# Patient Record
Sex: Male | Born: 1985 | Race: White | Hispanic: No | Marital: Single | Smoking: Current every day smoker
Health system: Southern US, Community
[De-identification: ages and names within clinical notes are randomized; demographics above are authoritative.]

## PROBLEM LIST (undated history)

## (undated) DIAGNOSIS — S83209A Unspecified tear of unspecified meniscus, current injury, unspecified knee, initial encounter: Secondary | ICD-10-CM

---

## 2020-11-21 ENCOUNTER — Emergency Department (HOSPITAL_COMMUNITY): Payer: 59

## 2020-11-21 ENCOUNTER — Emergency Department (HOSPITAL_COMMUNITY)
Admission: EM | Admit: 2020-11-21 | Discharge: 2020-11-21 | Disposition: A | Discharge status: Home / Self Care | Payer: 59 | Attending: Emergency Medicine | Admitting: Emergency Medicine

## 2020-11-21 ENCOUNTER — Encounter (HOSPITAL_COMMUNITY): Payer: Self-pay | Admitting: Emergency Medicine

## 2020-11-21 DIAGNOSIS — W16822A Jumping or diving into other water striking bottom causing other injury, initial encounter: Secondary | ICD-10-CM | POA: Diagnosis not present

## 2020-11-21 DIAGNOSIS — M25461 Effusion, right knee: Secondary | ICD-10-CM | POA: Insufficient documentation

## 2020-11-21 DIAGNOSIS — M25561 Pain in right knee: Secondary | ICD-10-CM

## 2020-11-21 NOTE — Discharge Instructions (Addendum)
At this time there does not appear to be the presence of an emergent medical condition, however there is always the potential for conditions to change. Please read and follow the below instructions.  Please return to the Emergency Department immediately for any new or worsening symptoms. Please be sure to follow up with your Primary Care Provider within one week regarding your visit today; please call their office to schedule an appointment even if you are feeling better for a follow-up visit. Please use the knee immobilizer and crutches given to you today.  Please keep weight off of your right leg until you are seen and evaluated by the orthopedic specialist.  Please call the orthopedic specialist Dr. Carola Frost on your discharge paperwork for a follow-up appointment and further treatment of your right knee injury. Please take Ibuprofen (Advil, motrin) and Tylenol (acetaminophen) to relieve your pain.  You may take up to 400 MG (2 pills) of normal strength ibuprofen every 8 hours as needed.  You make take tylenol, up to 500 mg (one extra strength pill) every 8 hours as needed. It is safe to take ibuprofen and tylenol at the same time as they work differently.  Do not take more than 3,000 mg tylenol in a 24 hour period (not more than one dose every 8 hours.  Please check all medication labels as many medications such as pain and cold medications may contain tylenol.  Do not drink alcohol while taking these medications.  Do not take other NSAID'S while taking ibuprofen (such as aleve or naproxen).  Please take ibuprofen with food to decrease stomach upset.  Go to the nearest Emergency Department immediately if: You have fever or chills Your knee swells, and the swelling gets worse. You cannot move your knee. You have very bad knee pain that does not get better with pain medicine. You have redness around your knee You have any new/concerning or worsening of symptoms    Please read the additional  information packets attached to your discharge summary.  Do not take your medicine if  develop an itchy rash, swelling in your mouth or lips, or difficulty breathing; call 911 and seek immediate emergency medical attention if this occurs.  You may review your lab tests and imaging results in their entirety on your MyChart account.  Please discuss all results of fully with your primary care provider and other specialist at your follow-up visit.  Note: Portions of this text may have been transcribed using voice recognition software. Every effort was made to ensure accuracy; however, inadvertent computerized transcription errors may still be present.

## 2020-11-21 NOTE — ED Notes (Signed)
Patient transported to x-ray. ?

## 2020-11-21 NOTE — ED Provider Notes (Signed)
[facility] EMERGENCY DEPARTMENT Provider Note   CSN: [id number] Arrival date & time: 11/21/20  0840     History Chief Complaint  Patient presents with   Knee Pain    Harold Dudley is a 35 y.o. male otherwise healthy no daily medication use.  Patient reports that he initially injured his right knee in December 2021, he had a family member on his back and attempted to lift them up to "pop his cousins back" when he felt a pop in his right knee.  He reports he had some pain initially that slowly resolved, his knee had been feeling better over the past few months.  On Thursday, 4 days ago, patient was attempting to jump over a small ditch near his apartment complex, when he landed he felt his right knee pop again.  He reports immediate onset medial right knee pain constant nonradiating worse with ambulation improves with rest and elevation.  Patient noticed some swelling along the right knee the next day.  Denies head injury, loss conscious, blood thinner use, neck pain, chest pain, back pain, abdominal pain, numbness/tingling, weakness, pain of the other extremities or any additional concerns.  HPI     History reviewed. No pertinent past medical history.  There are no problems to display for this patient.        No family history on file.     Home Medications Prior to Admission medications   Not on File    Allergies    Patient has no known allergies.  Review of Systems   Review of Systems  Constitutional: Negative.  Negative for chills and fever.  Cardiovascular: Negative.  Negative for chest pain.  Gastrointestinal: Negative.  Negative for abdominal pain.  Musculoskeletal: Positive for arthralgias. Negative for back pain and neck pain.  Skin: Negative for color change.  Neurological: Negative.  Negative for syncope, weakness and headaches.    Physical Exam Updated Vital Signs BP (!) 132/95 (BP Location: Right Arm)    Pulse 95    Temp 97.8 F  (36.6 C) (Oral)    Resp 14    SpO2 100%   Physical Exam Constitutional:      General: He is not in acute distress.    Appearance: Normal appearance. He is well-developed. He is not ill-appearing or diaphoretic.  HENT:     Head: Normocephalic and atraumatic.  Eyes:     General: Vision grossly intact. Gaze aligned appropriately.     Pupils: Pupils are equal, round, and reactive to light.  Neck:     Trachea: Trachea and phonation normal.  Cardiovascular:     Pulses:          Dorsalis pedis pulses are 2+ on the right side and 2+ on the left side.  Pulmonary:     Effort: Pulmonary effort is normal. No respiratory distress.  Abdominal:     General: There is no distension.     Palpations: Abdomen is soft.     Tenderness: There is no abdominal tenderness. There is no guarding or rebound.  Musculoskeletal:        General: Normal range of motion.     Cervical back: Normal range of motion.     Comments: Right Knee: Moderate suprapatellar effusion present otherwise appearance is normal.  No obvious deformity.  No erythema, increased heat or break of the skin.  Tenderness over medial joint line.  Flexion and extension reduced secondary to pain.  Resisted flexion and extension intact.  Patient is  able to straight leg raise, no evidence for patella tendon rupture negative anterior/posterior drawers.  No pain with varus stress test.  Positive pain with valgus stress test. No tenderness to palpation of hips or ankles. No tenderness along the tibial plateau.  Compartments soft. Neurovascularly intact distally to site of injury.  Good capillary refill and sensation intact to all toes.  Strong equal pedal pulses.   Feet:     Right foot:     Protective Sensation: 5 sites tested. 5 sites sensed.     Left foot:     Protective Sensation: 5 sites tested. 5 sites sensed.  Skin:    General: Skin is warm and dry.  Neurological:     Mental Status: He is alert.     GCS: GCS eye subscore is 4. GCS verbal  subscore is 5. GCS motor subscore is 6.     Comments: Speech is clear and goal oriented, follows commands Major Cranial nerves without deficit, no facial droop Moves extremities without ataxia, coordination intact  Psychiatric:        Behavior: Behavior normal.     ED Results / Procedures / Treatments   Labs (all labs ordered are listed, but only abnormal results are displayed) Labs Reviewed - No data to display  EKG None  Radiology DG Knee Complete 4 Views Right  Result Date: 11/21/2020 CLINICAL DATA:  Anterolateral right knee pain and swelling. Injury in December 2021. EXAM: RIGHT KNEE - COMPLETE 4+ VIEW COMPARISON:  None. FINDINGS: Moderate suprapatellar right knee joint effusion. No fracture or dislocation. No suspicious focal osseous lesions. No significant degenerative changes. No radiopaque foreign bodies. IMPRESSION: Moderate suprapatellar right knee joint effusion. No fracture or malalignment. Electronically Signed   By: Delbert Phenix M.D.   On: 11/21/2020 09:10    Procedures Procedures   Medications Ordered in ED Medications - No data to display  ED Course  I have reviewed the triage vital signs and the nursing notes.  Pertinent labs & imaging results that were available during my care of the patient were reviewed by me and considered in my medical decision making (see chart for details).    MDM Rules/Calculators/A&P                         Additional history obtained from: 1. Nursing notes from this visit. ------------------ 35 year old male presents for injury of the right knee initially he injured it lifting his cousin in December.  He reinjured it 4 days ago jumping over a small ditch.  He has been unable to ambulate on the knee secondary to pain since that time.  He has suprapatellar knee effusion on exam and some medial joint line pain and pain with valgus stress test.  Patella tendon intact.  Resisted flexion extension is intact.  He has no tenderness along the  tibial plateau.  Exam is suspicious for soft tissue injury, ligamentous possibly meniscus.  I have personally reviewed patient's x-ray and agree with radiologist interpretation, he has an effusion but no evidence for fracture or dislocation.  Plan of care will be knee immobilizer, crutches and nonweightbearing until evaluated by orthopedist.  Patient given referral to on-call orthopedist Dr. Carola Frost and encouraged to call office today to schedule a follow-up appointment.  Work note given.  RICE therapy discussed along with OTC anti-inflammatories.  No evidence for septic arthritis, DVT, compartment syndrome, cellulitis, fracture/dislocation, neurovascular compromise/arterial injury or other emergent pathologies at this time.  At this time  there does not appear to be any evidence of an acute emergency medical condition and the patient appears stable for discharge with appropriate outpatient follow up. Diagnosis was discussed with patient who verbalizes understanding of care plan and is agreeable to discharge. I have discussed return precautions with patient who verbalizes understanding. Patient encouraged to follow-up with their PCP and Dr. Carola Frost. All questions answered.   Note: Portions of this report may have been transcribed using voice recognition software. Every effort was made to ensure accuracy; however, inadvertent computerized transcription errors may still be present. Final Clinical Impression(s) / ED Diagnoses Final diagnoses:  Acute pain of right knee  Effusion of right knee    Rx / DC Orders ED Discharge Orders    None       Elizabeth Palau 11/21/20 4098    Margarita Grizzle, MD 11/22/20 1357

## 2020-11-21 NOTE — ED Triage Notes (Signed)
Pt arrives with right knee pain, he reports an initial injury in dec when trying to pop his family members back he heard his knee pop 3 times. This injury slowly got better and then on Thursday 3/17 he re injured his knee and felt like he bent to the side has had increase in swelling and pain. He is wearing an ace bandage and using crutches.

## 2020-11-21 NOTE — Progress Notes (Signed)
Orthopedic Tech Progress Note Patient Details:  Harold Dudley 31-Aug-1986 [id number]  Ortho Devices Type of Ortho Device: Knee Immobilizer,Crutches Ortho Device/Splint Location: RLE Ortho Device/Splint Interventions: Ordered,Application,Adjustment   Post Interventions Patient Tolerated: Ambulated well,Well Instructions Provided: Poper ambulation with device,Care of device   Donald Pore 11/21/2020, 10:02 AM

## 2020-11-22 ENCOUNTER — Encounter: Payer: Self-pay | Admitting: Orthopedic Surgery

## 2020-11-22 ENCOUNTER — Ambulatory Visit (INDEPENDENT_AMBULATORY_CARE_PROVIDER_SITE_OTHER): Payer: 59 | Admitting: Physician Assistant

## 2020-11-22 DIAGNOSIS — S83242D Other tear of medial meniscus, current injury, left knee, subsequent encounter: Secondary | ICD-10-CM | POA: Diagnosis not present

## 2020-11-22 NOTE — Progress Notes (Signed)
Office Visit Note   Patient: Harold Dudley           Date of Birth: 08-05-1986           MRN: [id number] Visit Date: 11/22/2020              Requested by: No referring provider defined for this encounter. PCP: Pcp, No  Chief Complaint  Patient presents with  . Right Knee - Injury      HPI: This is a pleasant 35 year old gentleman who works maintenance person for apartment complexes.  He states in December he was lifting a family member on his back and felt a significant knee pain.  He also had some associated mild swelling.  He did have some mild symptoms of instability at that time.  He did self treat this but it never did really recover.  He has used anti-inflammatories.  A few days ago he was jumping over a small ditch with his dog.  He landed on his "good leg "and then slipped in the mud and had a twisting injury to his right knee.  He had significant pain and since has had feelings of instability.  He does focus most of the pain around the medial patellar border and medial joint line.  He also finds flexion of his knee quite painful.  He is wearing a knee immobilizer today and using crutches  Assessment & Plan: Visit Diagnoses:  1. Other tear of medial meniscus, current injury, left knee, subsequent encounter     Plan: Findings concerning for acute meniscus injury and possible ligamentous injury.  Recommend MRI.  This is now but injury that occurred over 3 months ago and had exacerbation causing instability.  Follow-Up Instructions: No follow-ups on file.   Ortho Exam  Patient is alert, oriented, no adenopathy, well-dressed, normal affect, normal respiratory effort. Right knee he has a mild suprapatellar effusion.  No erythema.  No tenderness over the lateral joint line.  He does have some tenderness over the medial joint line and medial patellar border.  He is able to sustain a straight leg raise.  He is able to extend against resistance.  He is very hesitant to flex his knee  past 50 degrees.  Terminal extension is not very painful.  Has some increased translation when compared to the unaffected side with anterior draw  Imaging: No results found. No images are attached to the encounter.  Labs: No results found for: HGBA1C, ESRSEDRATE, CRP, LABURIC, REPTSTATUS, GRAMSTAIN, CULT, LABORGA   No results found for: ALBUMIN, PREALBUMIN, CBC  No results found for: MG No results found for: VD25OH  No results found for: PREALBUMIN No flowsheet data found.   There is no height or weight on file to calculate BMI.  Orders:  Orders Placed This Encounter  Procedures  . MR Knee Right w/o contrast   No orders of the defined types were placed in this encounter.    Procedures: No procedures performed  Clinical Data: No additional findings.  ROS:  All other systems negative, except as noted in the HPI. Review of Systems  Objective: Vital Signs: There were no vitals taken for this visit.  Specialty Comments:  No specialty comments available.  PMFS History: There are no problems to display for this patient.  No past medical history on file.  No family history on file.   Social History   Occupational History  . Not on file  Tobacco Use  . Smoking status: Not on file  .  Smokeless tobacco: Not on file  Substance and Sexual Activity  . Alcohol use: Not on file  . Drug use: Not on file  . Sexual activity: Not on file

## 2020-12-02 ENCOUNTER — Ambulatory Visit
Admission: RE | Admit: 2020-12-02 | Discharge: 2020-12-02 | Disposition: A | Discharge status: Home / Self Care | Payer: 59 | Source: Ambulatory Visit | Attending: Physician Assistant | Admitting: Physician Assistant

## 2020-12-02 ENCOUNTER — Other Ambulatory Visit: Payer: Self-pay

## 2020-12-02 DIAGNOSIS — S83242D Other tear of medial meniscus, current injury, left knee, subsequent encounter: Secondary | ICD-10-CM

## 2020-12-06 ENCOUNTER — Other Ambulatory Visit: Payer: 59

## 2020-12-07 ENCOUNTER — Ambulatory Visit (INDEPENDENT_AMBULATORY_CARE_PROVIDER_SITE_OTHER): Payer: 59 | Admitting: Orthopedic Surgery

## 2020-12-07 DIAGNOSIS — S8991XA Unspecified injury of right lower leg, initial encounter: Secondary | ICD-10-CM | POA: Diagnosis not present

## 2020-12-08 ENCOUNTER — Telehealth: Payer: Self-pay | Admitting: Orthopedic Surgery

## 2020-12-08 ENCOUNTER — Encounter (HOSPITAL_BASED_OUTPATIENT_CLINIC_OR_DEPARTMENT_OTHER): Payer: Self-pay | Admitting: Orthopedic Surgery

## 2020-12-08 ENCOUNTER — Ambulatory Visit: Payer: 59 | Admitting: Orthopedic Surgery

## 2020-12-08 ENCOUNTER — Other Ambulatory Visit (HOSPITAL_COMMUNITY): Payer: 59

## 2020-12-08 ENCOUNTER — Other Ambulatory Visit: Payer: Self-pay

## 2020-12-08 NOTE — Telephone Encounter (Signed)
FMLA forms received from [employer]. Sent to [employer].

## 2020-12-08 NOTE — Telephone Encounter (Signed)
[employer] forms received. Sent to [employer]. 

## 2020-12-09 ENCOUNTER — Other Ambulatory Visit: Payer: Self-pay

## 2020-12-09 ENCOUNTER — Other Ambulatory Visit (HOSPITAL_COMMUNITY)
Admission: RE | Admit: 2020-12-09 | Discharge: 2020-12-09 | Disposition: A | Discharge status: Home / Self Care | Payer: 59 | Source: Ambulatory Visit | Attending: Orthopedic Surgery | Admitting: Orthopedic Surgery

## 2020-12-09 DIAGNOSIS — Z20822 Contact with and (suspected) exposure to covid-19: Secondary | ICD-10-CM | POA: Insufficient documentation

## 2020-12-09 DIAGNOSIS — Z01812 Encounter for preprocedural laboratory examination: Secondary | ICD-10-CM | POA: Insufficient documentation

## 2020-12-09 LAB — SARS CORONAVIRUS 2 (TAT 6-24 HRS): SARS Coronavirus 2: NEGATIVE

## 2020-12-09 NOTE — Progress Notes (Signed)
Sent message to remind pt to go for covid test.

## 2020-12-10 ENCOUNTER — Encounter: Payer: Self-pay | Admitting: Orthopedic Surgery

## 2020-12-10 NOTE — Progress Notes (Signed)
Office Visit Note   Patient: Harold Dudley           Date of Birth: 06-Sep-1985           MRN: [id number] Visit Date: 12/07/2020 Requested by: No referring provider defined for this encounter. PCP: Pcp, No  Subjective: Chief Complaint  Patient presents with  . Right Knee - Pain    HPI: Harold Dudley is a 35 y.o. male who presents to the office complaining of right knee injury.  Patient complains of right knee discomfort since 08/25/2021 when he cracked his cousins back who weighs 200 pounds and felt a pop in his knee.  He felt okay at the time but then he had swelling and got to the point where he could not weight-bear for several days.  After this resolved he had some continued discomfort but was overall functional with the knee until about 3 weeks ago when he jumped over an embankment and twisted his knee upon landing.  He felt severe sudden pain and difficulty weightbearing on the leg after that.  He had immediate swelling.  He was seen by Vincenza Hews, PA-C who ordered MRI of the right knee for further evaluation.  He is here today to discuss MRI results.  MRI revealed acute complete tear of the ACL with typical pivot shift osseous injuries without any meniscal damage or damage to other ligaments.  He does work in apartment maintenance which involves lifting.  His main hobbies are playing videogames and D&D.  No history of right knee surgery.  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  No personal or family history of DVT or pulmonary embolism.              ROS: All systems reviewed are negative as they relate to the chief complaint within the history of present illness.  Patient denies fevers or chills.  Assessment & Plan: Visit Diagnoses:  1. Acute injury of anterior cruciate ligament of right knee, initial encounter     Plan: Patient is a 35 year old male who presents complaining of acutely worse right knee pain in the last 3 weeks.  He had MRI that revealed complete ACL tear without  any meniscus injury.  Discussed options available to patient.  He works in apartment maintenance which requires his need to feel fairly stable and he has not been able to return to work since the injury given his symptomatic instability.  After discussion, plan to proceed with ACL reconstruction.  Discussed the risks and benefits of the procedure including knee stiffness, recurrent knee instability, need for revision surgery, period of nonweightbearing if meniscus pathology was found, medical complication from surgery, DVT/PE.  After discussion of risks and the recovery timeline, patient wishes to proceed with surgery.  Follow-up after procedure.  Anticipate likely at least 3 months out from his work in maintenance.  Could be a month or longer possibly.  Follow-Up Instructions: No follow-ups on file.   Orders:  No orders of the defined types were placed in this encounter.  No orders of the defined types were placed in this encounter.     Procedures: No procedures performed   Clinical Data: No additional findings.  Objective: Vital Signs: There were no vitals taken for this visit.  Physical Exam:  Constitutional: Patient appears well-developed HEENT:  Head: Normocephalic Eyes:EOM are normal Neck: Normal range of motion Cardiovascular: Normal rate Pulmonary/chest: Effort normal Neurologic: Patient is alert Skin: Skin is warm Psychiatric: Patient has normal mood and affect  Ortho Exam: Ortho exam demonstrates right knee with 3 to 5 degrees of extension and about 95 degrees of knee flexion.  Moderate effusion noted.  No calf tenderness.  Negative Homans' sign.  Laxity with anterior drawer and Lachman exam   No laxity with varus/valgus stress or posterior drawer.  No instability to posterior lateral rotational stressing.  Negative dial test.  Able to perform straight leg raise.  No tenderness of the quadricep, patellar tendons or the patella.  No pain with hip range of motion.  Dorsalis  pedis pulse 2+ out of 4 and ankle dorsiflexion intact  Specialty Comments:  No specialty comments available.  Imaging: No results found.   PMFS History: There are no problems to display for this patient.  Past Medical History:  Diagnosis Date  . Acute torn meniscus     No family history on file.  No past surgical history on file. Social History   Occupational History  . Not on file  Tobacco Use  . Smoking status: Current Every Day Smoker    Packs/day: 0.50    Types: Cigarettes  . Smokeless tobacco: Never Used  Substance and Sexual Activity  . Alcohol use: Never  . Drug use: Never  . Sexual activity: Not on file

## 2020-12-11 ENCOUNTER — Encounter: Payer: Self-pay | Admitting: Orthopedic Surgery

## 2020-12-12 ENCOUNTER — Encounter: Payer: Self-pay | Admitting: Orthopedic Surgery

## 2020-12-12 ENCOUNTER — Other Ambulatory Visit: Payer: Self-pay

## 2020-12-12 ENCOUNTER — Ambulatory Visit (HOSPITAL_BASED_OUTPATIENT_CLINIC_OR_DEPARTMENT_OTHER): Payer: 59 | Admitting: Anesthesiology

## 2020-12-12 ENCOUNTER — Ambulatory Visit (HOSPITAL_BASED_OUTPATIENT_CLINIC_OR_DEPARTMENT_OTHER)
Admission: RE | Admit: 2020-12-12 | Discharge: 2020-12-12 | Disposition: A | Discharge status: Home / Self Care | Payer: 59 | Attending: Orthopedic Surgery | Admitting: Orthopedic Surgery

## 2020-12-12 ENCOUNTER — Encounter (HOSPITAL_BASED_OUTPATIENT_CLINIC_OR_DEPARTMENT_OTHER): Payer: Self-pay | Admitting: Orthopedic Surgery

## 2020-12-12 ENCOUNTER — Encounter (HOSPITAL_BASED_OUTPATIENT_CLINIC_OR_DEPARTMENT_OTHER)
Admission: RE | Disposition: A | Discharge status: Home / Self Care | Payer: Self-pay | Source: Home / Self Care | Attending: Orthopedic Surgery

## 2020-12-12 DIAGNOSIS — Y9389 Activity, other specified: Secondary | ICD-10-CM | POA: Diagnosis not present

## 2020-12-12 DIAGNOSIS — S83512D Sprain of anterior cruciate ligament of left knee, subsequent encounter: Secondary | ICD-10-CM

## 2020-12-12 DIAGNOSIS — S83511D Sprain of anterior cruciate ligament of right knee, subsequent encounter: Secondary | ICD-10-CM | POA: Diagnosis not present

## 2020-12-12 DIAGNOSIS — S83511A Sprain of anterior cruciate ligament of right knee, initial encounter: Secondary | ICD-10-CM

## 2020-12-12 DIAGNOSIS — F17210 Nicotine dependence, cigarettes, uncomplicated: Secondary | ICD-10-CM | POA: Insufficient documentation

## 2020-12-12 DIAGNOSIS — X58XXXA Exposure to other specified factors, initial encounter: Secondary | ICD-10-CM | POA: Insufficient documentation

## 2020-12-12 DIAGNOSIS — S83281A Other tear of lateral meniscus, current injury, right knee, initial encounter: Secondary | ICD-10-CM

## 2020-12-12 DIAGNOSIS — S83281D Other tear of lateral meniscus, current injury, right knee, subsequent encounter: Secondary | ICD-10-CM | POA: Diagnosis not present

## 2020-12-12 HISTORY — DX: Unspecified tear of unspecified meniscus, current injury, unspecified knee, initial encounter: S83.209A

## 2020-12-12 HISTORY — PX: KNEE ARTHROSCOPY WITH ANTERIOR CRUCIATE LIGAMENT (ACL) REPAIR: SHX5644

## 2020-12-12 SURGERY — KNEE ARTHROSCOPY WITH ANTERIOR CRUCIATE LIGAMENT (ACL) REPAIR
Anesthesia: General | Site: Knee | Laterality: Right

## 2020-12-12 MED ORDER — ACETAMINOPHEN 10 MG/ML IV SOLN
INTRAVENOUS | Status: AC
  Filled 2020-12-12: qty 100

## 2020-12-12 MED ORDER — FENTANYL CITRATE (PF) 100 MCG/2ML IJ SOLN
INTRAMUSCULAR | Status: DC | PRN
  Administered 2020-12-12 (×3): 25 ug via INTRAVENOUS
  Administered 2020-12-12: 100 ug via INTRAVENOUS
  Administered 2020-12-12 (×2): 25 ug via INTRAVENOUS
  Administered 2020-12-12: 50 ug via INTRAVENOUS
  Administered 2020-12-12 (×5): 25 ug via INTRAVENOUS

## 2020-12-12 MED ORDER — HYDROMORPHONE HCL 1 MG/ML IJ SOLN
INTRAMUSCULAR | Status: AC
  Filled 2020-12-12: qty 0.5

## 2020-12-12 MED ORDER — FENTANYL CITRATE (PF) 100 MCG/2ML IJ SOLN
INTRAMUSCULAR | Status: AC
  Filled 2020-12-12: qty 2

## 2020-12-12 MED ORDER — ACETAMINOPHEN 10 MG/ML IV SOLN
1000.0000 mg | Freq: Once | INTRAVENOUS | Status: AC
  Administered 2020-12-12: 1000 mg via INTRAVENOUS

## 2020-12-12 MED ORDER — CLONIDINE HCL (ANALGESIA) 100 MCG/ML EP SOLN
EPIDURAL | Status: DC | PRN
  Administered 2020-12-12: 100 ug via INTRA_ARTICULAR

## 2020-12-12 MED ORDER — FENTANYL CITRATE (PF) 100 MCG/2ML IJ SOLN
100.0000 ug | Freq: Once | INTRAMUSCULAR | Status: AC
  Administered 2020-12-12: 100 ug via INTRAVENOUS

## 2020-12-12 MED ORDER — PROPOFOL 10 MG/ML IV BOLUS
INTRAVENOUS | Status: AC
  Filled 2020-12-12: qty 20

## 2020-12-12 MED ORDER — CEFAZOLIN SODIUM-DEXTROSE 2-4 GM/100ML-% IV SOLN: 2.0000 g | INTRAVENOUS | Status: DC

## 2020-12-12 MED ORDER — MEPERIDINE HCL 25 MG/ML IJ SOLN: 6.2500 mg | INTRAMUSCULAR | Status: DC | PRN

## 2020-12-12 MED ORDER — VANCOMYCIN HCL 500 MG IV SOLR
INTRAVENOUS | Status: DC | PRN
  Administered 2020-12-12: 500 mg via TOPICAL

## 2020-12-12 MED ORDER — FENTANYL CITRATE (PF) 100 MCG/2ML IJ SOLN: 25.0000 ug | INTRAMUSCULAR | Status: DC | PRN

## 2020-12-12 MED ORDER — DEXAMETHASONE SODIUM PHOSPHATE 10 MG/ML IJ SOLN
INTRAMUSCULAR | Status: DC | PRN
  Administered 2020-12-12: 10 mg

## 2020-12-12 MED ORDER — PROPOFOL 10 MG/ML IV BOLUS
INTRAVENOUS | Status: DC | PRN
  Administered 2020-12-12: 200 mg via INTRAVENOUS

## 2020-12-12 MED ORDER — EPINEPHRINE PF 1 MG/ML IJ SOLN
INTRAMUSCULAR | Status: AC
  Filled 2020-12-12: qty 1

## 2020-12-12 MED ORDER — CEFAZOLIN SODIUM-DEXTROSE 2-3 GM-%(50ML) IV SOLR
INTRAVENOUS | Status: DC | PRN
  Administered 2020-12-12: 2 g via INTRAVENOUS

## 2020-12-12 MED ORDER — SODIUM CHLORIDE 0.9 % IR SOLN
Status: DC | PRN
  Administered 2020-12-12: 1 mL

## 2020-12-12 MED ORDER — OXYCODONE-ACETAMINOPHEN 5-325 MG PO TABS: 1.0000 | ORAL_TABLET | ORAL | 0 refills | Status: AC | PRN

## 2020-12-12 MED ORDER — ASPIRIN 81 MG PO CHEW: 81.0000 mg | CHEWABLE_TABLET | Freq: Every day | ORAL | 0 refills | Status: AC

## 2020-12-12 MED ORDER — DEXAMETHASONE SODIUM PHOSPHATE 10 MG/ML IJ SOLN
INTRAMUSCULAR | Status: DC | PRN
  Administered 2020-12-12: 10 mg via INTRAVENOUS

## 2020-12-12 MED ORDER — LACTATED RINGERS IV SOLN: INTRAVENOUS | Status: DC | PRN

## 2020-12-12 MED ORDER — CLONIDINE HCL (ANALGESIA) 100 MCG/ML EP SOLN
EPIDURAL | Status: AC
  Filled 2020-12-12: qty 10

## 2020-12-12 MED ORDER — OXYCODONE HCL 5 MG/5ML PO SOLN: 5.0000 mg | Freq: Once | ORAL | Status: AC | PRN

## 2020-12-12 MED ORDER — ACETAMINOPHEN 160 MG/5ML PO SOLN: 325.0000 mg | ORAL | Status: DC | PRN

## 2020-12-12 MED ORDER — ONDANSETRON HCL 4 MG/2ML IJ SOLN: 4.0000 mg | Freq: Once | INTRAMUSCULAR | Status: DC | PRN

## 2020-12-12 MED ORDER — ONDANSETRON HCL 4 MG/2ML IJ SOLN
INTRAMUSCULAR | Status: AC
  Filled 2020-12-12: qty 2

## 2020-12-12 MED ORDER — METHOCARBAMOL 500 MG PO TABS: 500.0000 mg | ORAL_TABLET | Freq: Three times a day (TID) | ORAL | 0 refills | Status: AC | PRN

## 2020-12-12 MED ORDER — BUPIVACAINE-EPINEPHRINE (PF) 0.5% -1:200000 IJ SOLN
INTRAMUSCULAR | Status: DC | PRN
  Administered 2020-12-12: 20 mL

## 2020-12-12 MED ORDER — ROPIVACAINE HCL 7.5 MG/ML IJ SOLN
INTRAMUSCULAR | Status: DC | PRN
  Administered 2020-12-12: 25 mL via PERINEURAL

## 2020-12-12 MED ORDER — ACETAMINOPHEN 325 MG PO TABS
325.0000 mg | ORAL_TABLET | ORAL | Status: DC | PRN
Start: 2020-12-12 — End: 2020-12-12

## 2020-12-12 MED ORDER — OXYCODONE HCL 5 MG PO TABS
5.0000 mg | ORAL_TABLET | Freq: Once | ORAL | Status: AC | PRN
  Administered 2020-12-12: 5 mg via ORAL

## 2020-12-12 MED ORDER — POVIDONE-IODINE 10 % EX SWAB
2.0000 "application " | Freq: Once | CUTANEOUS | Status: AC
  Administered 2020-12-12: 2 via TOPICAL

## 2020-12-12 MED ORDER — CEFAZOLIN SODIUM-DEXTROSE 2-4 GM/100ML-% IV SOLN
INTRAVENOUS | Status: AC
  Filled 2020-12-12: qty 100

## 2020-12-12 MED ORDER — MORPHINE SULFATE (PF) 4 MG/ML IV SOLN
INTRAVENOUS | Status: AC
  Filled 2020-12-12: qty 1

## 2020-12-12 MED ORDER — MORPHINE SULFATE (PF) 4 MG/ML IV SOLN
INTRAVENOUS | Status: DC | PRN
  Administered 2020-12-12: 4 mg via INTRAMUSCULAR

## 2020-12-12 MED ORDER — DEXMEDETOMIDINE HCL 200 MCG/2ML IV SOLN
INTRAVENOUS | Status: DC | PRN
Start: 2020-12-12 — End: 2020-12-12
  Administered 2020-12-12: 12 ug via INTRAVENOUS
  Administered 2020-12-12: 8 ug via INTRAVENOUS

## 2020-12-12 MED ORDER — POVIDONE-IODINE 7.5 % EX SOLN: Freq: Once | CUTANEOUS | Status: AC

## 2020-12-12 MED ORDER — ONDANSETRON HCL 4 MG/2ML IJ SOLN
INTRAMUSCULAR | Status: DC | PRN
  Administered 2020-12-12: 4 mg via INTRAVENOUS

## 2020-12-12 MED ORDER — VANCOMYCIN HCL 500 MG IV SOLR
INTRAVENOUS | Status: AC
  Filled 2020-12-12: qty 500

## 2020-12-12 MED ORDER — MIDAZOLAM HCL 2 MG/2ML IJ SOLN
INTRAMUSCULAR | Status: DC | PRN
  Administered 2020-12-12: 2 mg via INTRAVENOUS

## 2020-12-12 MED ORDER — OXYCODONE HCL 5 MG PO TABS
ORAL_TABLET | ORAL | Status: AC
  Filled 2020-12-12: qty 1

## 2020-12-12 MED ORDER — MIDAZOLAM HCL 2 MG/2ML IJ SOLN
INTRAMUSCULAR | Status: AC
  Filled 2020-12-12: qty 2

## 2020-12-12 MED ORDER — MIDAZOLAM HCL 2 MG/2ML IJ SOLN
2.0000 mg | Freq: Once | INTRAMUSCULAR | Status: AC
  Administered 2020-12-12: 2 mg via INTRAVENOUS

## 2020-12-12 MED ORDER — DEXAMETHASONE SODIUM PHOSPHATE 10 MG/ML IJ SOLN
INTRAMUSCULAR | Status: AC
  Filled 2020-12-12: qty 1

## 2020-12-12 MED ORDER — LACTATED RINGERS IV SOLN: INTRAVENOUS | Status: DC

## 2020-12-12 MED ORDER — HYDROMORPHONE HCL 1 MG/ML IJ SOLN
0.2500 mg | INTRAMUSCULAR | Status: DC | PRN
  Administered 2020-12-12 (×4): 0.5 mg via INTRAVENOUS

## 2020-12-12 MED ORDER — KETOROLAC TROMETHAMINE 30 MG/ML IJ SOLN
INTRAMUSCULAR | Status: DC | PRN
  Administered 2020-12-12: 30 mg via INTRAVENOUS

## 2020-12-12 MED ORDER — KETOROLAC TROMETHAMINE 10 MG PO TABS: 10.0000 mg | ORAL_TABLET | Freq: Three times a day (TID) | ORAL | 0 refills | Status: AC | PRN

## 2020-12-12 SURGICAL SUPPLY — 107 items
ANCHOR BUTTON TIGHTROPE RN 14 (Anchor) ×2 IMPLANT
ANCHOR SUT MENISCAL REV 2-0 (Anchor) ×2 IMPLANT
BANDAGE ESMARK 6X9 LF (GAUZE/BANDAGES/DRESSINGS) IMPLANT
BLADE EXCALIBUR 4.0X13 (MISCELLANEOUS) IMPLANT
BLADE SHAVER TORPEDO 4X13 (MISCELLANEOUS) IMPLANT
BLADE SURG 10 STRL SS (BLADE) ×2 IMPLANT
BLADE SURG 15 STRL LF DISP TIS (BLADE) ×1 IMPLANT
BLADE SURG 15 STRL SS (BLADE) ×2
BNDG CMPR 9X6 STRL LF SNTH (GAUZE/BANDAGES/DRESSINGS)
BNDG ELASTIC 4X5.8 VLCR STR LF (GAUZE/BANDAGES/DRESSINGS) ×4 IMPLANT
BNDG ELASTIC 6X5.8 VLCR STR LF (GAUZE/BANDAGES/DRESSINGS) ×2 IMPLANT
BNDG ESMARK 6X9 LF (GAUZE/BANDAGES/DRESSINGS)
BURR OVAL 8 FLU 4.0X13 (MISCELLANEOUS) ×2 IMPLANT
COOLER ICEMAN CLASSIC (MISCELLANEOUS) ×2 IMPLANT
COVER BACK TABLE 60X90IN (DRAPES) ×2 IMPLANT
COVER WAND RF STERILE (DRAPES) IMPLANT
CUFF TOURN SGL QUICK 34 (TOURNIQUET CUFF) ×2
CUFF TRNQT CYL 34X4.125X (TOURNIQUET CUFF) ×1 IMPLANT
CUTTER TENSIONER SUT 2-0 0 FBW (INSTRUMENTS) ×2 IMPLANT
DISSECTOR  3.8MM X 13CM (MISCELLANEOUS)
DISSECTOR 3.8MM X 13CM (MISCELLANEOUS) IMPLANT
DISSECTOR 4.0MM X 13CM (MISCELLANEOUS) ×2 IMPLANT
DRAPE ARTHROSCOPY W/POUCH 90 (DRAPES) ×2 IMPLANT
DRAPE IMP U-DRAPE 54X76 (DRAPES) IMPLANT
DRAPE INCISE IOBAN 66X45 STRL (DRAPES) ×2 IMPLANT
DRAPE OEC MINIVIEW 54X84 (DRAPES) ×2 IMPLANT
DRAPE U-SHAPE 47X51 STRL (DRAPES) IMPLANT
DRILL FLIPCUTTER III 6-12 (ORTHOPEDIC DISPOSABLE SUPPLIES) ×1 IMPLANT
DRSG PAD ABDOMINAL 8X10 ST (GAUZE/BANDAGES/DRESSINGS) IMPLANT
DRSG TEGADERM 4X4.75 (GAUZE/BANDAGES/DRESSINGS) ×12 IMPLANT
DURAPREP 26ML APPLICATOR (WOUND CARE) ×2 IMPLANT
DW OUTFLOW CASSETTE/TUBE SET (MISCELLANEOUS) ×2 IMPLANT
ELECT REM PT RETURN 9FT ADLT (ELECTROSURGICAL) ×2
ELECTRODE REM PT RTRN 9FT ADLT (ELECTROSURGICAL) ×1 IMPLANT
EXCALIBUR 3.8MM X 13CM (MISCELLANEOUS) IMPLANT
FIBERSTICK 2 (SUTURE) ×6 IMPLANT
FLIPCUTTER III 6-12 AR-1204FF (ORTHOPEDIC DISPOSABLE SUPPLIES) ×2
GAUZE SPONGE 4X4 12PLY STRL (GAUZE/BANDAGES/DRESSINGS) ×2 IMPLANT
GAUZE XEROFORM 1X8 LF (GAUZE/BANDAGES/DRESSINGS) ×4 IMPLANT
GLOVE SRG 8 PF TXTR STRL LF DI (GLOVE) ×1 IMPLANT
GLOVE SURG LTX SZ6.5 (GLOVE) ×4 IMPLANT
GLOVE SURG LTX SZ8 (GLOVE) ×4 IMPLANT
GLOVE SURG UNDER POLY LF SZ7 (GLOVE) ×2 IMPLANT
GLOVE SURG UNDER POLY LF SZ8 (GLOVE) ×2
GOWN STRL REUS W/ TWL LRG LVL3 (GOWN DISPOSABLE) ×1 IMPLANT
GOWN STRL REUS W/ TWL XL LVL3 (GOWN DISPOSABLE) ×2 IMPLANT
GOWN STRL REUS W/TWL LRG LVL3 (GOWN DISPOSABLE) ×2
GOWN STRL REUS W/TWL XL LVL3 (GOWN DISPOSABLE) ×4
IMMOBILIZER KNEE 22 UNIV (SOFTGOODS) IMPLANT
IMMOBILIZER KNEE 24 THIGH 36 (MISCELLANEOUS) ×1 IMPLANT
IMMOBILIZER KNEE 24 UNIV (MISCELLANEOUS) ×2
IMP SYS 2ND FIX PEEK 4.75X19.1 (Miscellaneous) ×2 IMPLANT
IMPL SYS 2ND FX PEEK 4.75X19.1 (Miscellaneous) ×1 IMPLANT
IMPL TIGHTROP ABS ACL FIBERTG (Orthopedic Implant) ×1 IMPLANT
IMPL TIGHTROP FIBERTAG ACL (Orthopedic Implant) ×1 IMPLANT
IMPL TIGHTROPE ABS ACL FIBERTG (Orthopedic Implant) ×2 IMPLANT
IMPLANT TIGHTROPE FIBERTAG ACL (Orthopedic Implant) ×2 IMPLANT
KIT BIOCARTILAGE LG JOINT MIX (KITS) ×4 IMPLANT
MANIFOLD NEPTUNE II (INSTRUMENTS) ×2 IMPLANT
NDL SAFETY ECLIPSE 18X1.5 (NEEDLE) ×2 IMPLANT
NEEDLE HYPO 18GX1.5 BLUNT FILL (NEEDLE) ×2 IMPLANT
NEEDLE HYPO 18GX1.5 SHARP (NEEDLE) ×4
NEEDLE SPNL 18GX3.5 QUINCKE PK (NEEDLE) IMPLANT
PACK ARTHROSCOPY DSU (CUSTOM PROCEDURE TRAY) ×2 IMPLANT
PACK BASIN DAY SURGERY FS (CUSTOM PROCEDURE TRAY) ×2 IMPLANT
PAD CAST 4YDX4 CTTN HI CHSV (CAST SUPPLIES) ×1 IMPLANT
PAD COLD SHLDR UNI XL WRAP-ON (PAD) ×2
PAD COLD SHLDR WRAP-ON (PAD) ×2 IMPLANT
PAD COLD UNI XL WRAP-ON (PAD) ×1 IMPLANT
PADDING CAST COTTON 4X4 STRL (CAST SUPPLIES) ×2
PADDING CAST COTTON 6X4 STRL (CAST SUPPLIES) ×2 IMPLANT
PENCIL SMOKE EVACUATOR (MISCELLANEOUS) ×2 IMPLANT
PORT APPOLLO RF 90DEGREE MULTI (SURGICAL WAND) ×2 IMPLANT
PUTTY DBM ALLOSYNC PURE 5CC (Putty) ×2 IMPLANT
SLEEVE SCD COMPRESS KNEE MED (STOCKING) ×2 IMPLANT
SPONGE LAP 18X18 RF (DISPOSABLE) ×2 IMPLANT
SPONGE LAP 4X18 RFD (DISPOSABLE) ×4 IMPLANT
STRIP CLOSURE SKIN 1/2X4 (GAUZE/BANDAGES/DRESSINGS) ×2 IMPLANT
SUCTION FRAZIER HANDLE 10FR (MISCELLANEOUS) ×1
SUCTION TUBE FRAZIER 10FR DISP (MISCELLANEOUS) ×1 IMPLANT
SUT ETHILON 3 0 PS 1 (SUTURE) ×4 IMPLANT
SUT FIBERWIRE #2 38 T-5 BLUE (SUTURE) ×2
SUT FIBERWIRE 2-0 18 17.9 3/8 (SUTURE) ×2
SUT MNCRL AB 3-0 PS2 18 (SUTURE) ×2 IMPLANT
SUT VIC AB 0 CT1 27 (SUTURE) ×6
SUT VIC AB 0 CT1 27XBRD ANBCTR (SUTURE) ×3 IMPLANT
SUT VIC AB 1 CT1 27 (SUTURE) ×4
SUT VIC AB 1 CT1 27XBRD ANBCTR (SUTURE) ×2 IMPLANT
SUT VIC AB 2-0 CT1 27 (SUTURE) ×4
SUT VIC AB 2-0 CT1 TAPERPNT 27 (SUTURE) ×2 IMPLANT
SUT VIC AB 2-0 SH 27 (SUTURE) ×2
SUT VIC AB 2-0 SH 27XBRD (SUTURE) ×1 IMPLANT
SUT VICRYL 0 UR6 27IN ABS (SUTURE) IMPLANT
SUTURE FIBERWR #2 38 T-5 BLUE (SUTURE) ×1 IMPLANT
SUTURE FIBERWR 2-0 18 17.9 3/8 (SUTURE) ×1 IMPLANT
SUTURE TAPE 1.3 FIBERLOP 20 ST (SUTURE) ×2 IMPLANT
SUTURE TAPE TIGERLINK 1.3MM BL (SUTURE) IMPLANT
SUTURETAPE 1.3 FIBERLOOP 20 ST (SUTURE) ×4
SUTURETAPE TIGERLINK 1.3MM BL (SUTURE)
SYR 5ML LL (SYRINGE) ×2 IMPLANT
SYR BULB IRRIG 60ML STRL (SYRINGE) IMPLANT
SYR TB 1ML LL NO SAFETY (SYRINGE) ×2 IMPLANT
TOWEL GREEN STERILE FF (TOWEL DISPOSABLE) ×4 IMPLANT
TRAY DSU PREP LF (CUSTOM PROCEDURE TRAY) ×2 IMPLANT
TUBING ARTHROSCOPY IRRIG 16FT (MISCELLANEOUS) ×2 IMPLANT
WRAP KNEE MAXI GEL POST OP (GAUZE/BANDAGES/DRESSINGS) IMPLANT
YANKAUER SUCT BULB TIP NO VENT (SUCTIONS) ×2 IMPLANT

## 2020-12-12 NOTE — H&P (Signed)
Harold Dudley is an 35 y.o. male.   Chief Complaint: Right knee pain and instability HPI: Harold Dudley is a 34 y.o. male who presents to the office complaining of right knee injury.  Patient complains of right knee discomfort since 08/25/2021 when he cracked his cousins back who weighs 200 pounds and felt a pop in his knee.  He felt okay at the time but then he had swelling and got to the point where he could not weight-bear for several days.  After this resolved he had some continued discomfort but was overall functional with the knee until about 3 weeks ago when he jumped over an embankment and twisted his knee upon landing.  He felt severe sudden pain and difficulty weightbearing on the leg after that.  He had immediate swelling.  He was seen by Vincenza Hews, PA-C who ordered MRI of the right knee for further evaluation. Marland Kitchen  MRI revealed acute complete tear of the ACL with typical pivot shift osseous injuries without any meniscal damage or damage to other ligaments.  He does work in apartment maintenance which involves lifting.  His main hobbies are playing videogames and D&D.  No history of right knee surgery.  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  No personal or family history of DVT or pulmonary embolism.  Past Medical History:  Diagnosis Date  . Acute torn meniscus     History reviewed. No pertinent surgical history.  History reviewed. No pertinent family history. Social History:  reports that he has been smoking cigarettes. He has been smoking about 0.50 packs per day. He has never used smokeless tobacco. He reports that he does not drink alcohol and does not use drugs.  Allergies: No Known Allergies  Medications Prior to Admission  Medication Sig Dispense Refill  . ibuprofen (ADVIL) 600 MG tablet Take 600 mg by mouth every 6 (six) hours as needed.      No results found for this or any previous visit (from the past 48 hour(s)). No results found.  Review of Systems   Musculoskeletal: Positive for arthralgias.  All other systems reviewed and are negative.   Blood pressure 126/89, pulse 77, temperature 97.8 F (36.6 C), temperature source Oral, resp. rate 11, height 5\' 5"  (1.651 m), weight 76.8 kg, SpO2 99 %. Physical Exam Vitals reviewed.  HENT:     Head: Normocephalic.     Nose: Nose normal.     Mouth/Throat:     Mouth: Mucous membranes are moist.  Eyes:     Pupils: Pupils are equal, round, and reactive to light.  Cardiovascular:     Rate and Rhythm: Normal rate.     Pulses: Normal pulses.  Pulmonary:     Effort: Pulmonary effort is normal.  Abdominal:     General: Abdomen is flat.  Musculoskeletal:        General: Normal range of motion.     Cervical back: Normal range of motion.  Skin:    General: Skin is warm.     Capillary Refill: Capillary refill takes less than 2 seconds.  Neurological:     General: No focal deficit present.     Mental Status: He is alert.  Psychiatric:        Mood and Affect: Mood normal.   Ortho exam demonstrates about 3 degrees in full extension with trace effusion stable collateral ligaments on the right-hand side.  ACL laxity is present.  No posterior rotatory instability is noted.  Skin is intact.  Extensor  mechanism is intact.  Ankle dorsiflexion intact.  Flexion is to about 10 5-1 10.  Assessment/Plan  Patient is a 35 year old male who presents complaining of acutely worse right knee pain in the last 3 weeks.  He had MRI that revealed complete ACL tear without any meniscus injury.  Discussed options available to patient.  He works in apartment maintenance which requires his need to feel fairly stable and he has not been able to return to work since the injury given his symptomatic instability.  After discussion, plan to proceed with ACL reconstruction.  Discussed the risks and benefits of the procedure including knee stiffness, recurrent knee instability, need for revision surgery, period of nonweightbearing  if meniscus pathology was found, medical complication from surgery, DVT/PE.  After discussion of risks and the recovery timeline, patient wishes to proceed with surgery.  Follow-up after procedure.  Anticipate likely at least 3 months out from his work in maintenance.  Could be a month or longer possibly.  Plan at this time is to use quadriceps autograft.  9 mm graft is most likely based on the patient's height.  Could always revert to hamstring if necessary.  All questions answered  Burnard Bunting, MD 12/12/2020, 9:03 AM

## 2020-12-12 NOTE — Anesthesia Procedure Notes (Addendum)
Anesthesia Regional Block: Adductor canal block   Pre-Anesthetic Checklist: ,, timeout performed, Correct Patient, Correct Site, Correct Laterality, Correct Procedure, Correct Position, site marked, Risks and benefits discussed,  Surgical consent,  Pre-op evaluation,  At surgeon's request and post-op pain management  Laterality: Right  Prep: chloraprep       Needles:  Injection technique: Single-shot  Needle Type: Echogenic Stimulator Needle     Needle Length: 5cm  Needle Gauge: 22     Additional Needles:   Procedures:, nerve stimulator,,, ultrasound used (permanent image in chart),,,,  Narrative:  Start time: 12/12/2020 8:30 AM End time: 12/12/2020 8:35 AM Injection made incrementally with aspirations every 5 mL.  Performed by: Personally  Anesthesiologist: Bethena Midget, MD  Additional Notes: Functioning IV was confirmed and monitors were applied.  A 11mm 22ga Arrow echogenic stimulator needle was used. Sterile prep and drape,hand hygiene and sterile gloves were used. Ultrasound guidance: relevant anatomy identified, needle position confirmed, local anesthetic spread visualized around nerve(s)., vascular puncture avoided.  Image printed for medical record. Negative aspiration and negative test dose prior to incremental administration of local anesthetic. The patient tolerated the procedure well.

## 2020-12-12 NOTE — Anesthesia Postprocedure Evaluation (Signed)
Anesthesia Post Note  Patient: Harold Dudley  Procedure(s) Performed: RIGHT KNEE ARTHROSCOPY WITH ANTERIOR CRUCIATE LIGAMENT (ACL) RECONSTRUCTION AND MENISCAL REPAIR (Right Knee)     Patient location during evaluation: PACU Anesthesia Type: General Level of consciousness: awake and alert Pain management: pain level controlled Vital Signs Assessment: post-procedure vital signs reviewed and stable Respiratory status: spontaneous breathing, nonlabored ventilation, respiratory function stable and patient connected to nasal cannula oxygen Cardiovascular status: blood pressure returned to baseline and stable Postop Assessment: no apparent nausea or vomiting Anesthetic complications: no   No complications documented.  Last Vitals:  Vitals:   12/12/20 1315 12/12/20 1430  BP: (!) 138/100 134/86  Pulse: 70 75  Resp: 12 16  Temp:  36.5 C  SpO2: 100% 100%    Last Pain:  Vitals:   12/12/20 1430  TempSrc:   PainSc: 2                  Setareh Rom

## 2020-12-12 NOTE — Discharge Instructions (Signed)
May take Tylenol after 7pm, if needed.  May take NSAIDS (Ibuprofen, Motri), after 6:15pm, if needed.    Post Anesthesia Home Care Instructions  Activity: Get plenty of rest for the remainder of the day. A responsible individual must stay with you for 24 hours following the procedure.  For the next 24 hours, DO NOT: -Drive a car -Advertising copywriter -Drink alcoholic beverages -Take any medication unless instructed by your physician -Make any legal decisions or sign important papers.  Meals: Start with liquid foods such as gelatin or soup. Progress to regular foods as tolerated. Avoid greasy, spicy, heavy foods. If nausea and/or vomiting occur, drink only clear liquids until the nausea and/or vomiting subsides. Call your physician if vomiting continues.  Special Instructions/Symptoms: Your throat may feel dry or sore from the anesthesia or the breathing tube placed in your throat during surgery. If this causes discomfort, gargle with warm salt water. The discomfort should disappear within 24 hours.  If you had a scopolamine patch placed behind your ear for the management of post- operative nausea and/or vomiting:  1. The medication in the patch is effective for 72 hours, after which it should be removed.  Wrap patch in a tissue and discard in the trash. Wash hands thoroughly with soap and water. 2. You may remove the patch earlier than 72 hours if you experience unpleasant side effects which may include dry mouth, dizziness or visual disturbances. 3. Avoid touching the patch. Wash your hands with soap and water after contact with the patch.    Regional Anesthesia Blocks  1. Numbness or the inability to move the "blocked" extremity may last from 3-48 hours after placement. The length of time depends on the medication injected and your individual response to the medication. If the numbness is not going away after 48 hours, call your surgeon.  2. The extremity that is blocked will need to be  protected until the numbness is gone and the  Strength has returned. Because you cannot feel it, you will need to take extra care to avoid injury. Because it may be weak, you may have difficulty moving it or using it. You may not know what position it is in without looking at it while the block is in effect.  3. For blocks in the legs and feet, returning to weight bearing and walking needs to be done carefully. You will need to wait until the numbness is entirely gone and the strength has returned. You should be able to move your leg and foot normally before you try and bear weight or walk. You will need someone to be with you when you first try to ensure you do not fall and possibly risk injury.  4. Bruising and tenderness at the needle site are common side effects and will resolve in a few days.  5. Persistent numbness or new problems with movement should be communicated to the surgeon or the [facility] [phone] [facility] [phone]).

## 2020-12-12 NOTE — Brief Op Note (Signed)
   12/12/2020  12:41 PM  PATIENT:  Harold Dudley  35 y.o. male  PRE-OPERATIVE DIAGNOSIS:  right anterior cruciate ligament tear lateral MENISCUS TEAR  POST-OPERATIVE DIAGNOSIS:  right anterior cruciate ligament tear lateral MENISCUS TEAR  PROCEDURE:  Procedure(s): RIGHT KNEE ARTHROSCOPY WITH ANTERIOR CRUCIATE LIGAMENT (ACL) RECONSTRUCTION AND MENISCAL REPAIR lateral  SURGEON:  Surgeon(s): Cammy Copa, MD  ASSISTANT: magnant pa  ANESTHESIA:   general  EBL: 5 ml    Total I/O In: 1000 [I.V.:1000] Out: 30 [Blood:30]  BLOOD ADMINISTERED: none  DRAINS: none   LOCAL MEDICATIONS USED:  Marcaine mso4 clonidine  SPECIMEN:  No Specimen  COUNTS:  YES  TOURNIQUET:   Total Tourniquet Time Documented: Thigh (Right) - 22 minutes Total: Thigh (Right) - 22 minutes   DICTATION: .Other Dictation: Dictation Number [id number] I think  PLAN OF CARE: Discharge to home after PACU  PATIENT DISPOSITION:  PACU - hemodynamically stable

## 2020-12-12 NOTE — Anesthesia Procedure Notes (Signed)
Procedure Name: LMA Insertion Performed by: Undrea Archbold M, CRNA Pre-anesthesia Checklist: Patient identified, Emergency Drugs available, Suction available and Patient being monitored Patient Re-evaluated:Patient Re-evaluated prior to induction Oxygen Delivery Method: Circle system utilized Preoxygenation: Pre-oxygenation with 100% oxygen Induction Type: IV induction Ventilation: Mask ventilation without difficulty LMA: LMA inserted LMA Size: 4.0 Number of attempts: 1 Airway Equipment and Method: Bite block Placement Confirmation: positive ETCO2 Tube secured with: Tape Dental Injury: Teeth and Oropharynx as per pre-operative assessment        

## 2020-12-12 NOTE — Anesthesia Preprocedure Evaluation (Addendum)
Anesthesia Evaluation  Patient identified by MRN, date of birth, ID band Patient awake    Reviewed: Allergy & Precautions, H&P , NPO status , Patient's Chart, lab work & pertinent test results, reviewed documented beta blocker date and time   Airway Mallampati: I  TM Distance: >3 FB Neck ROM: full    Dental no notable dental hx. (+) Teeth Intact, Dental Advisory Given, Missing,    Pulmonary Current SmokerPatient did not abstain from smoking.,    Pulmonary exam normal breath sounds clear to auscultation       Cardiovascular Exercise Tolerance: Good  Rhythm:regular Rate:Normal     Neuro/Psych    GI/Hepatic   Endo/Other    Renal/GU      Musculoskeletal   Abdominal   Peds  Hematology   Anesthesia Other Findings   Reproductive/Obstetrics                            Anesthesia Physical Anesthesia Plan  ASA: II  Anesthesia Plan: General   Post-op Pain Management: GA combined w/ Regional for post-op pain   Induction: Intravenous  PONV Risk Score and Plan: 2 and Ondansetron, Dexamethasone and Treatment may vary due to age or medical condition  Airway Management Planned: Oral ETT and LMA  Additional Equipment: None  Intra-op Plan:   Post-operative Plan: Extubation in OR  Informed Consent: I have reviewed the patients History and Physical, chart, labs and discussed the procedure including the risks, benefits and alternatives for the proposed anesthesia with the patient or authorized representative who has indicated his/her understanding and acceptance.     Dental Advisory Given  Plan Discussed with: CRNA and Anesthesiologist  Anesthesia Plan Comments: (Discussed both nerve block for pain relief post-op and GA; including NV, sore throat, dental injury, and pulmonary complications)        Anesthesia Quick Evaluation

## 2020-12-12 NOTE — Transfer of Care (Signed)
Immediate Anesthesia Transfer of Care Note  Patient: Harold Dudley  Procedure(s) Performed: RIGHT KNEE ARTHROSCOPY WITH ANTERIOR CRUCIATE LIGAMENT (ACL) RECONSTRUCTION AND MENISCAL REPAIR (Right Knee)  Patient Location: PACU  Anesthesia Type:General and Regional  Level of Consciousness: awake, alert  and oriented  Airway & Oxygen Therapy: Patient Spontanous Breathing and Patient connected to face mask oxygen  Post-op Assessment: Report given to RN and Post -op Vital signs reviewed and stable  Post vital signs: Reviewed and stable  Last Vitals:  Vitals Value Taken Time  BP    Temp    Pulse    Resp    SpO2      Last Pain:  Vitals:   12/12/20 0743  TempSrc: Oral  PainSc: 0-No pain         Complications: No complications documented.

## 2020-12-12 NOTE — Progress Notes (Signed)
AssistedDr. Oddono with right, ultrasound guided, adductor canal block. Side rails up, monitors on throughout procedure. See vital signs in flow sheet. Tolerated Procedure well.  

## 2020-12-13 ENCOUNTER — Encounter (HOSPITAL_BASED_OUTPATIENT_CLINIC_OR_DEPARTMENT_OTHER): Payer: Self-pay | Admitting: Orthopedic Surgery

## 2020-12-13 NOTE — Op Note (Signed)
Harold Dudley, HURLBUT MEDICAL RECORD NO: [id number] ACCOUNT NO: [id number] DATE OF BIRTH: [birth year] FACILITY: [facility] LOCATION: [facility] PHYSICIAN: Graylin Shiver. [name], MD  Operative Report   DATE OF PROCEDURE: 12/12/2020  PREOPERATIVE DIAGNOSIS:  Right knee anterior cruciate ligament tear, possible lateral meniscal tear.  POSTOPERATIVE DIAGNOSIS:  Right knee anterior cruciate ligament tear and lateral meniscal tear.  PROCEDURE PERFORMED:  Right knee anterior cruciate ligament reconstruction using quadriceps autograft 9 mm graft and all-inside lateral meniscal repair.  SURGEON:  Cammy Copa, MD  ASSISTANT:  Karenann Cai, PA  INDICATIONS:  The patient is a 35 year old patient with right knee pain following an injury.  He has had symptomatic instability and presents now for operative management after explanation of risks and benefits.  DESCRIPTION OF PROCEDURE:  The patient was brought to the operating room where general anesthetic was induced.  Preoperative antibiotics were administered.  Timeout was called.  Right leg was examined under anesthesia and found to have full range of  motion from full extension to full flexion.  Collaterals were stable to varus and valgus stress at 0, 30 degrees.  ACL laxity was present without posterolateral rotatory instability.  PCL was intact.  Right leg was prescrubbed with alcohol and Betadine,  allowed to air dry, prepped with DuraPrep solution, and draped in a sterile manner.  Collier Flowers was used to cover the operative field.  A timeout was called.  Leg was elevated and exsanguinated with the Esmarch wrap.  Tourniquet was inflated for a total  tourniquet time of 21 minutes.  Incision was made over the proximal pole of the patella extending proximally about 8 cm.  Skin and subcutaneous tissue sharply divided.  The quad tendon was observed.  It was deemed adequate in width for 9 mm harvester  blades.  Double wide 9 mm knife was then used to harvest a 75 mm  graft and it was detached from the proximal pole of the patella.  Prepared on the back table using Arthrex dual Endobutton technique, which was done by Karenann Cai, PA.  Next, thorough  irrigation was performed, and the quad tendon was closed using #1 Vicryl suture.  Tourniquet was released.  Skin was closed using 0 Vicryl suture, 2-0 Vicryl suture, and then later at the end of the case 3-0 Monocryl and Steri-Strips.  Anterior  inferolateral and anterior inferomedial portals were established.  Diagnostic arthroscopy was performed.  Medial compartment was intact with intact articular cartilage and meniscus.  ACL was torn.  PCL was intact.  Notchplasty and ACL debridement was  performed.  Over the top position identified.  Lateral meniscus was inspected and found to have partial-thickness vertical tear just at the level of the popliteus extending slightly anterior and posterior to that level.  A rasp was placed into this  defect.  One Arthrex all-inside suture was placed, which gave very secure fixation.  Next, knee was taken through range of motion, and the meniscus was found to be stable.  At this time, the femoral tunnel was drilled in the 9 o'clock position using the  FlipCutter 9 mm.  Tibial tunnel was then drilled at the posterior aspect of the native ACL footprint using 9.5 FlipCutter.  The graft, which had been soaked in vancomycin sponge, was then passed with the fluoroscopy used to confirm flipping of the  button.  Bone graft placed into the tunnels.  Good graft fixation was achieved on both sides with 28 mm of graft in the tunnel on each end.  This  was a very tight fit.  Next, the knee was placed into extension and the Endobutton fixation was used on the  tibial side.  Backup with SwiveLock was also performed.  An internal brace also utilized for extra stability.  Thorough irrigation of all incisions and the knee joint was performed.  It should be noted that prior to fixation on the tibial side knee  was  taken through range of motion about 25 reps.  Very nice graft placement without impingement and full extension was noted.  Thorough irrigation was then performed of the knee joint and all incisions.  Instruments were removed.  Portals closed using 2-0  Vicryl, 3-0 nylon.  The harvest site was closed using 3-0 Monocryl with Steri-Strips.  A solution of Marcaine, morphine, clonidine injected into the knee for postoperative pain relief.  Impervious dressing was placed along with Xeroform and 4 x 4's.  Ace  wrap, knee immobilizer, IceBand placed.  Luke's assistance was required at all times for the case for limb positioning, opening, closing, graft preparation.  His assistance was a medical necessity.   ROH D: 12/12/2020 12:41:06 pm T: 12/12/2020 4:07:00 pm  JOB: [id number]/ [id number]

## 2020-12-18 DIAGNOSIS — S83511A Sprain of anterior cruciate ligament of right knee, initial encounter: Secondary | ICD-10-CM

## 2020-12-18 DIAGNOSIS — S83281A Other tear of lateral meniscus, current injury, right knee, initial encounter: Secondary | ICD-10-CM

## 2020-12-21 ENCOUNTER — Encounter: Payer: 59 | Admitting: Orthopedic Surgery

## 2020-12-23 ENCOUNTER — Encounter (HOSPITAL_BASED_OUTPATIENT_CLINIC_OR_DEPARTMENT_OTHER): Payer: Self-pay | Admitting: Orthopedic Surgery

## 2020-12-23 ENCOUNTER — Ambulatory Visit (INDEPENDENT_AMBULATORY_CARE_PROVIDER_SITE_OTHER): Payer: 59 | Admitting: Orthopedic Surgery

## 2020-12-23 DIAGNOSIS — S8991XA Unspecified injury of right lower leg, initial encounter: Secondary | ICD-10-CM

## 2020-12-23 MED ORDER — HYDROCODONE-ACETAMINOPHEN 5-325 MG PO TABS: 1.0000 | ORAL_TABLET | Freq: Four times a day (QID) | ORAL | 0 refills | Status: AC | PRN

## 2020-12-24 ENCOUNTER — Encounter: Payer: Self-pay | Admitting: Orthopedic Surgery

## 2020-12-24 NOTE — Progress Notes (Signed)
   Post-Op Visit Note   Patient: Harold Dudley           Date of Birth: 05-Dec-1985           MRN: [id number] Visit Date: 12/23/2020 PCP: Pcp, No   Assessment & Plan:  Chief Complaint:  Chief Complaint  Patient presents with  . Right Knee - Routine Post Op   Visit Diagnoses:  1. Acute injury of anterior cruciate ligament of right knee, initial encounter     Plan: [name] is a 35 year old patient underwent right knee ACL reconstruction and meniscal repair 12/12/2020.  77 degrees on CPM.  Nonweightbearing.  Sutures intact.  Norco refilled.  No calf tenderness negative Homans today in the right leg.  He is on aspirin.  Sutures DC'd.  Plan for therapy to start next week.  Continue CPM until you get to 90 and then we will hold off at that time past 90.  Okay to go past 90 after his 2-week return.  Needs to work on Dance movement psychotherapist.  Okay to start weightbearing after his 2-week return as well.  Follow-Up Instructions: Return in about 2 weeks (around 01/06/2021).   Orders:  No orders of the defined types were placed in this encounter.  Meds ordered this encounter  Medications  . HYDROcodone-acetaminophen (NORCO/VICODIN) 5-325 MG tablet    Sig: Take 1 tablet by mouth every 6 (six) hours as needed for moderate pain.    Dispense:  35 tablet    Refill:  0    Imaging: No results found.  PMFS History: Patient Active Problem List   Diagnosis Date Noted  . Rupture of anterior cruciate ligament of right knee   . Acute lateral meniscus tear of right knee    Past Medical History:  Diagnosis Date  . Acute torn meniscus     History reviewed. No pertinent family history.  Past Surgical History:  Procedure Laterality Date  . KNEE ARTHROSCOPY WITH ANTERIOR CRUCIATE LIGAMENT (ACL) REPAIR Right 12/12/2020   Procedure: RIGHT KNEE ARTHROSCOPY WITH ANTERIOR CRUCIATE LIGAMENT (ACL) RECONSTRUCTION AND MENISCAL REPAIR;  Surgeon: Cammy Copa, MD;  Location: [facility];  Service:  Orthopedics;  Laterality: Right;   Social History   Occupational History  . Not on file  Tobacco Use  . Smoking status: Current Every Day Smoker    Packs/day: 0.50    Types: Cigarettes  . Smokeless tobacco: Never Used  Substance and Sexual Activity  . Alcohol use: Never  . Drug use: Never  . Sexual activity: Not on file

## 2021-01-06 ENCOUNTER — Encounter: Payer: 59 | Admitting: Orthopedic Surgery

## 2021-01-09 ENCOUNTER — Telehealth: Payer: Self-pay | Admitting: Orthopedic Surgery

## 2021-01-09 NOTE — Telephone Encounter (Signed)
Kipp Brood messaged me back stating he will reach out to patient to arrange time to pick up machine.

## 2021-01-09 NOTE — Telephone Encounter (Signed)
Pt called stating he had surgery and was given a machine in the process that he now needs picked up since he had to move out. Pts last day to have anything in the apartment is 01/12/21; he states he called Friday the 6th but never heard anything back. Pt needs this picked up ASAP and would like a CB when we have someone coming to get it, his roomamate has to leave for work @ 3 p.m.  [phone]

## 2021-01-09 NOTE — Telephone Encounter (Signed)
I have sent message to [facility] with [employer] asking for his assistance in this matter.

## 2021-01-18 ENCOUNTER — Telehealth: Payer: Self-pay

## 2021-01-18 NOTE — Telephone Encounter (Signed)
Pt called stating that he is now living in [place] and needs his medical records / referral sent to an office till his knee is fully healed and to start PT.

## 2021-01-19 NOTE — Telephone Encounter (Signed)
Do you know of anybody to refer to in [place]?

## 2021-01-20 ENCOUNTER — Telehealth: Payer: Self-pay

## 2021-01-20 NOTE — Telephone Encounter (Signed)
I called and talked to pt. Dr. [name] wrote a PT rx for the pt and as far a Ortho referral we have no recommendation unfortunately. Pt was informed that I will mail him the information needed to get PT set up in [place]. He was ok with this plan and would call back if anything else is needed.    Mailed to pt.

## 2021-01-23 NOTE — Telephone Encounter (Signed)
no

## 2023-02-20 IMAGING — CR DG KNEE COMPLETE 4+V*R*
4 series · 4 of 4 positions shown · non-contrast
Comparison: None.

CLINICAL DATA: Anterolateral right knee pain and swelling. Injury
in August 2020.

EXAM:
RIGHT KNEE - COMPLETE 4+ VIEW

[knee ap]
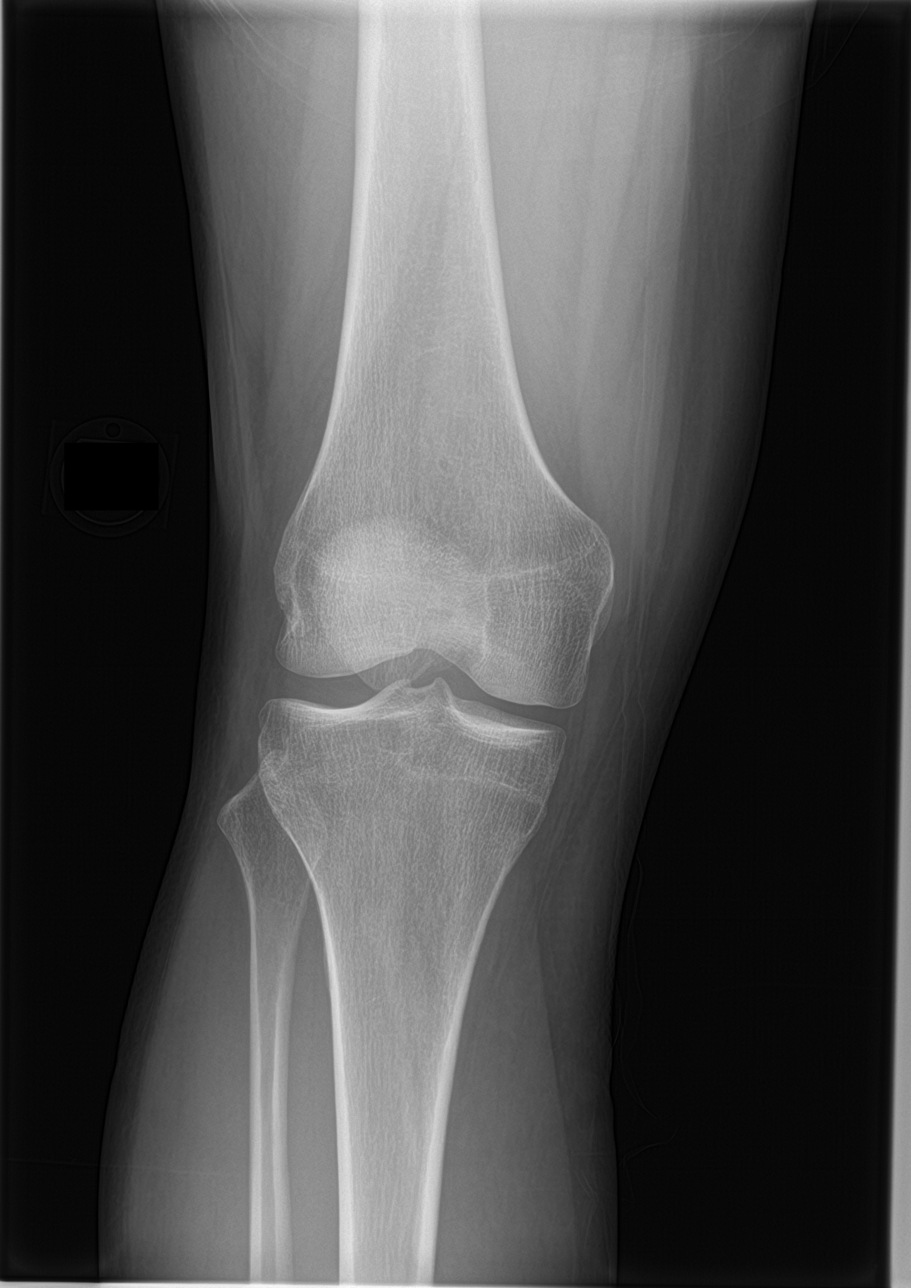

[knee lat]
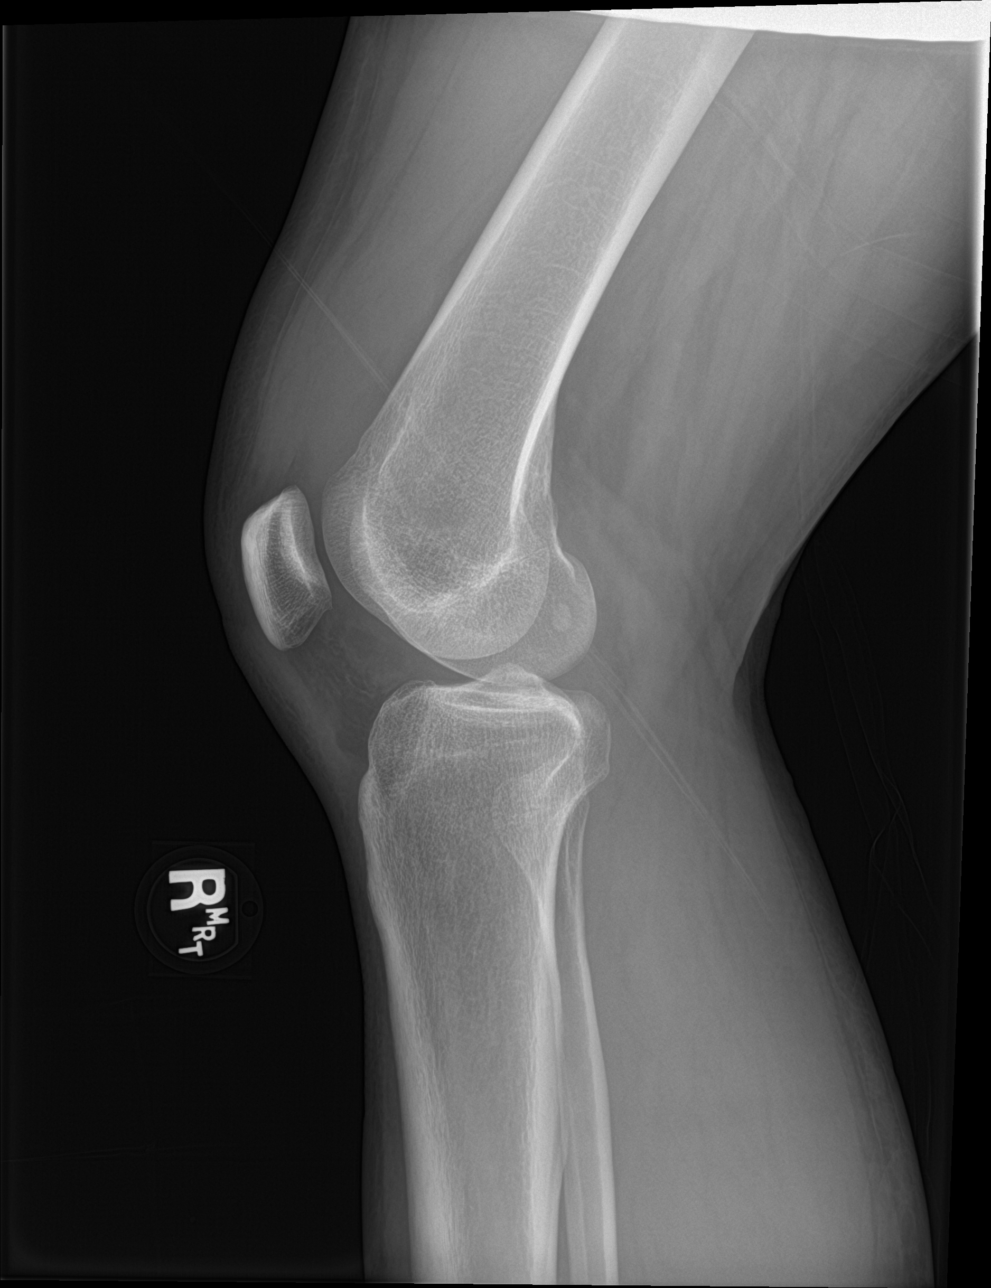

[knee obl (1 of 2)]
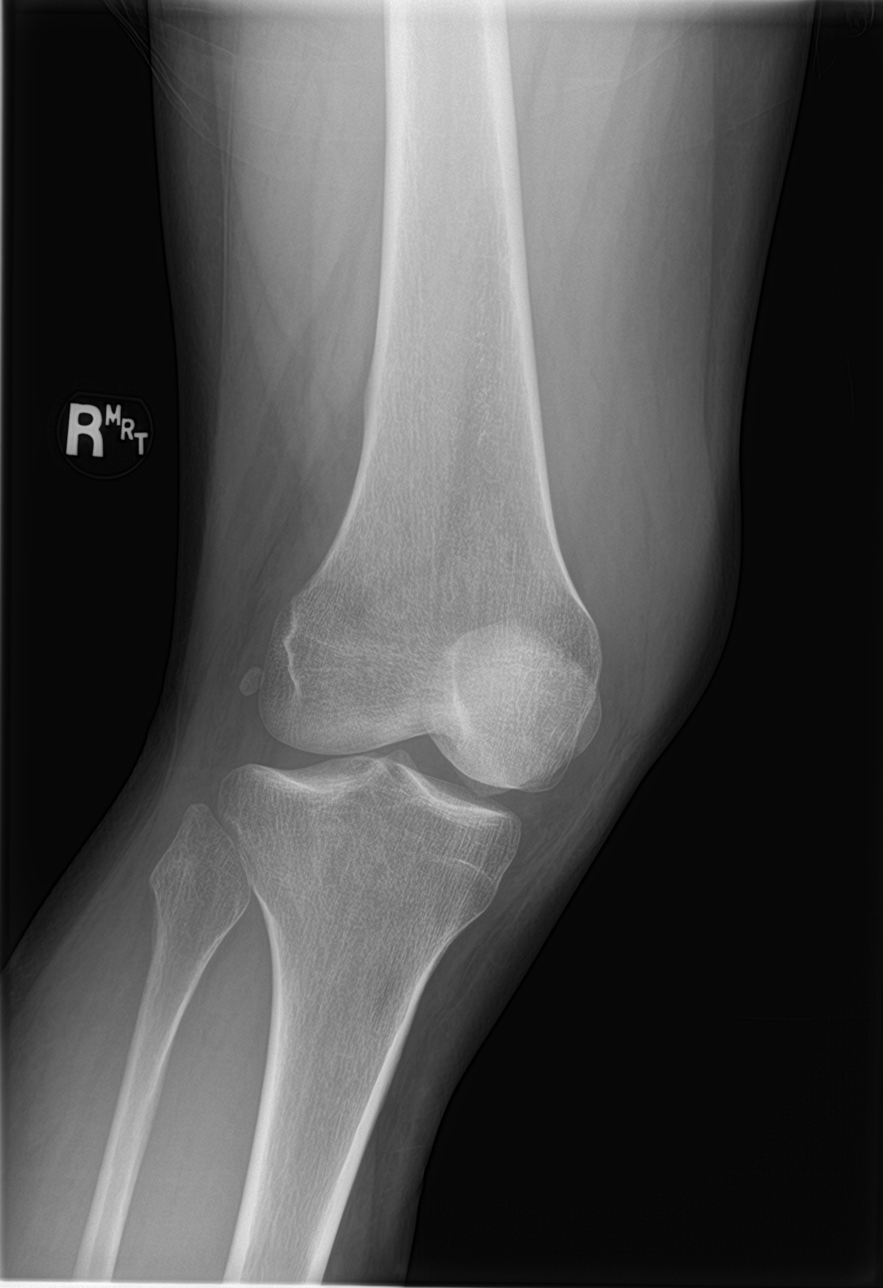

[knee obl (2 of 2)]
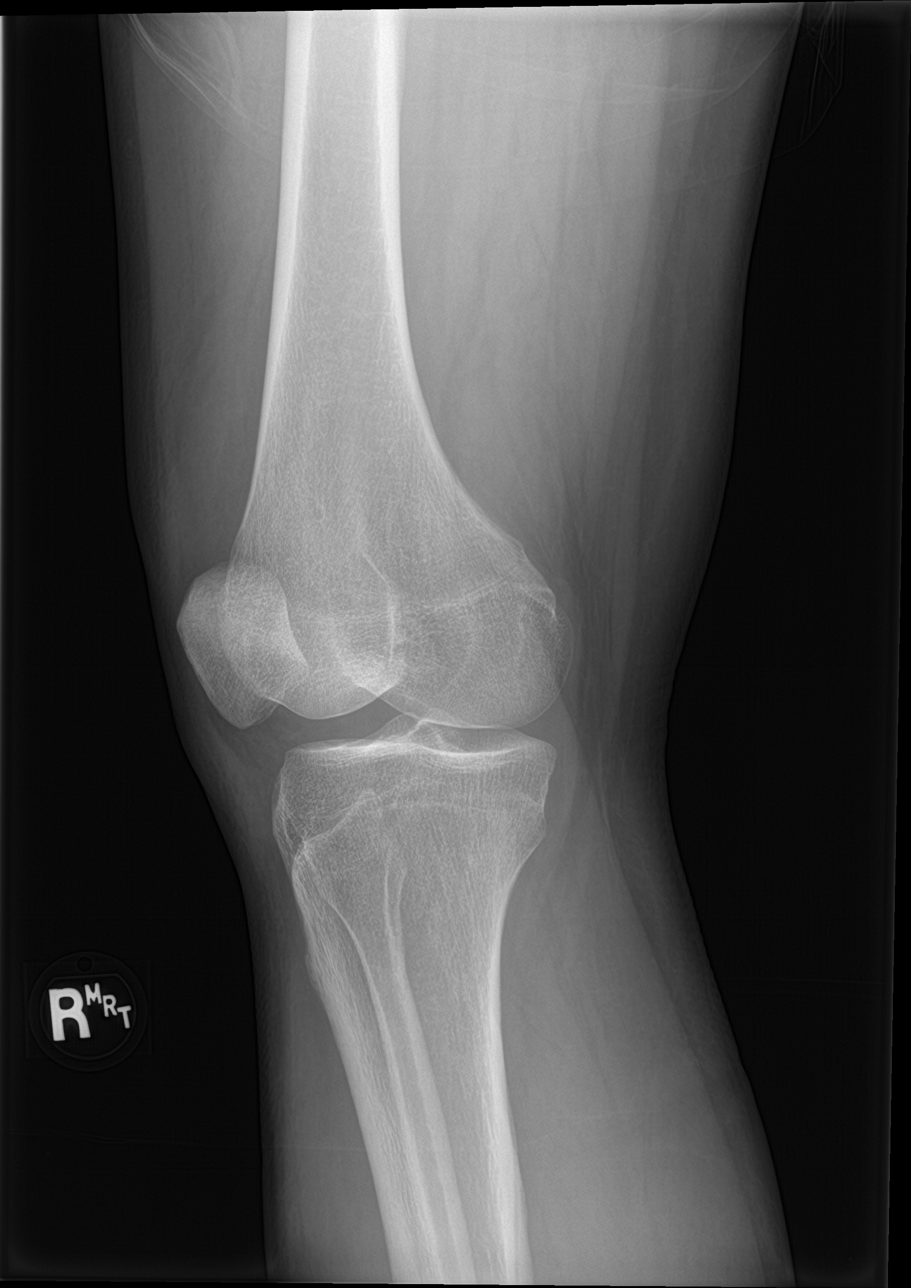

[4 of 4 positions shown; findings below may reference images not displayed]

FINDINGS: Moderate suprapatellar right knee joint effusion. No fracture or
dislocation. No suspicious focal osseous lesions. No significant
degenerative changes. No radiopaque foreign bodies.
IMPRESSION: Moderate suprapatellar right knee joint effusion. No fracture or
malalignment.
